# Patient Record
Sex: Male | Born: 1975 | Race: Black or African American | Hispanic: No | Marital: Single | Smoking: Never smoker
Health system: Southern US, Community
[De-identification: ages and names within clinical notes are randomized; demographics above are authoritative.]

## PROBLEM LIST (undated history)

## (undated) DIAGNOSIS — E119 Type 2 diabetes mellitus without complications: Secondary | ICD-10-CM

---

## 2007-06-19 ENCOUNTER — Emergency Department (HOSPITAL_COMMUNITY): Admission: EM | Admit: 2007-06-19 | Discharge: 2007-06-19 | Payer: Self-pay | Admitting: Emergency Medicine

## 2008-11-29 IMAGING — CT CT HEAD W/O CM
1 series · 16 of 30 positions shown, 20 images · non-contrast
Comparison: None

CLINICAL DATA: General headache

CT HEAD WITHOUT CONTRAST
TECHNIQUE: Contiguous axial images were obtained from the base of
the skull through the vertex without contrast.

[Series 2: headseq 4.8 h45s · axial · 0.43mm/px · z∈[-134,+21]mm · 16 of 36 slices shown, 20 images]
[im 2/36  brain]
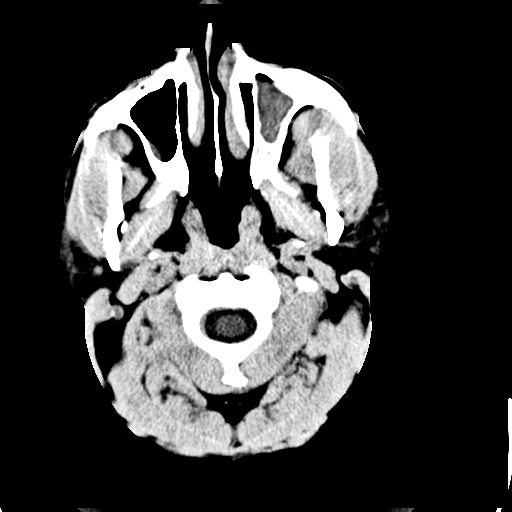
[im 2/36  bone]
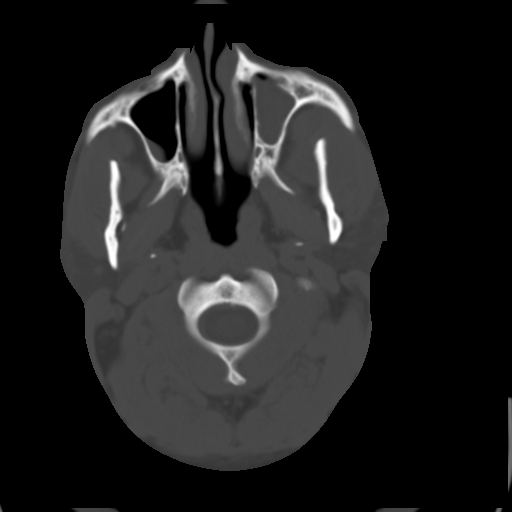
[im 4/36  brain]
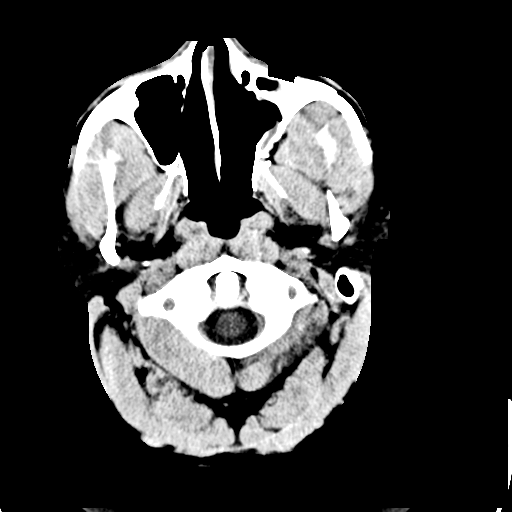
[im 7/36  brain]
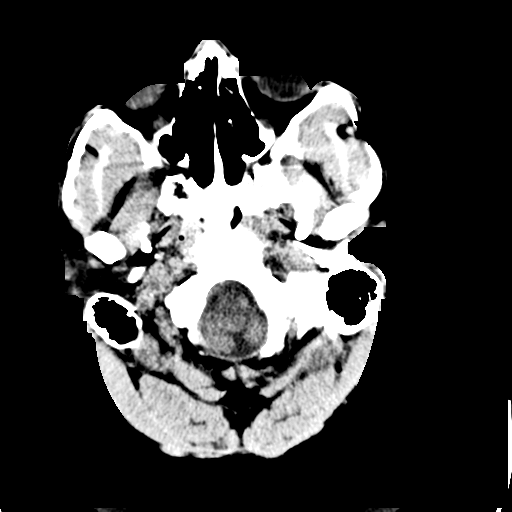
[im 9/36  brain]
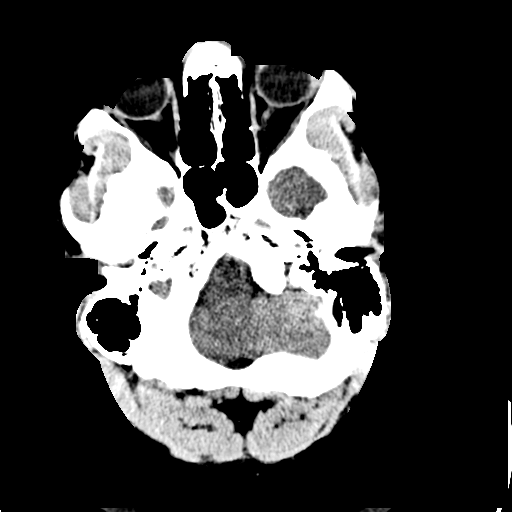
[im 10/36  brain]
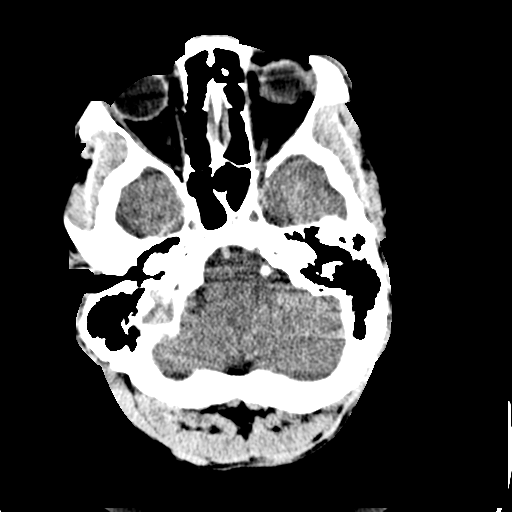
[im 10/36  bone]
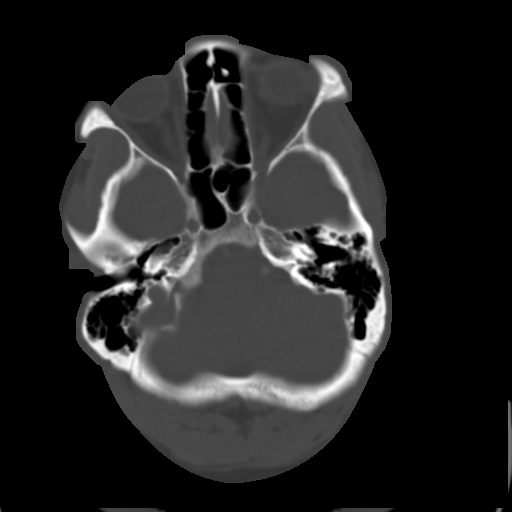
[im 13/36  brain]
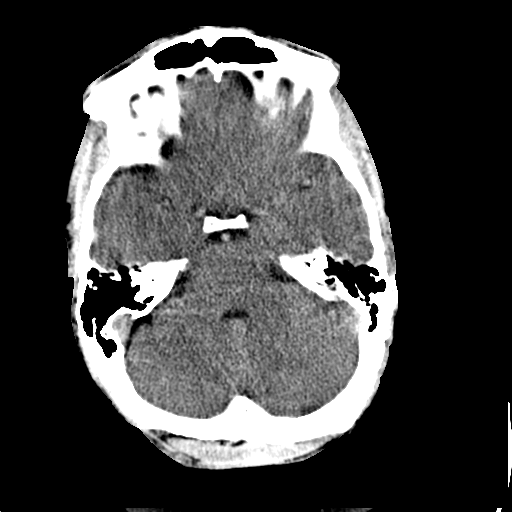
[im 15/36  brain]
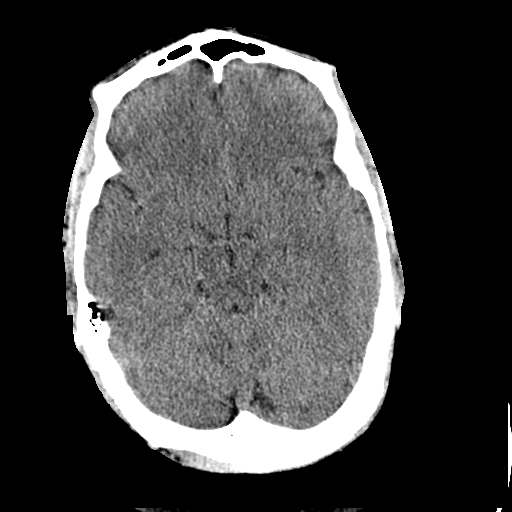
[im 17/36  brain]
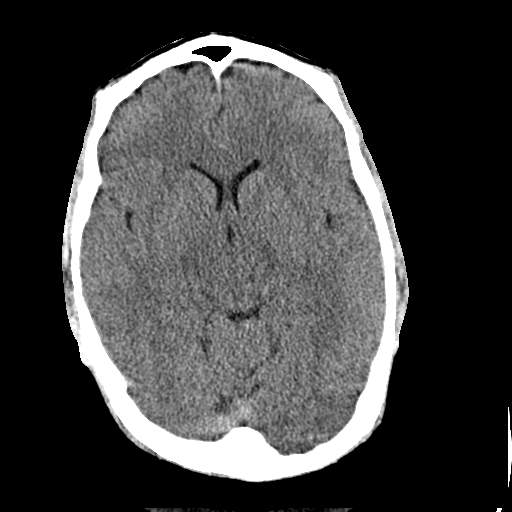
[im 19/36  brain]
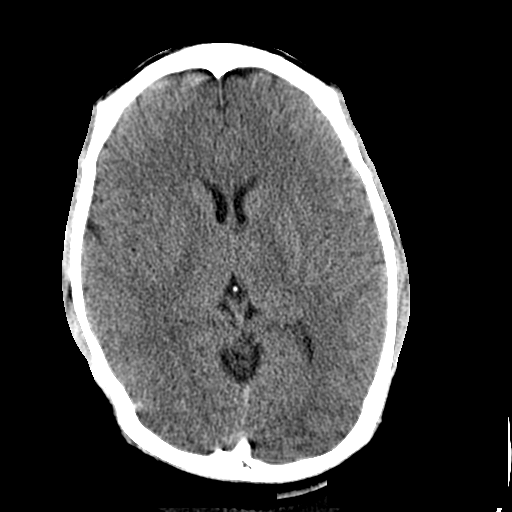
[im 19/36  bone]
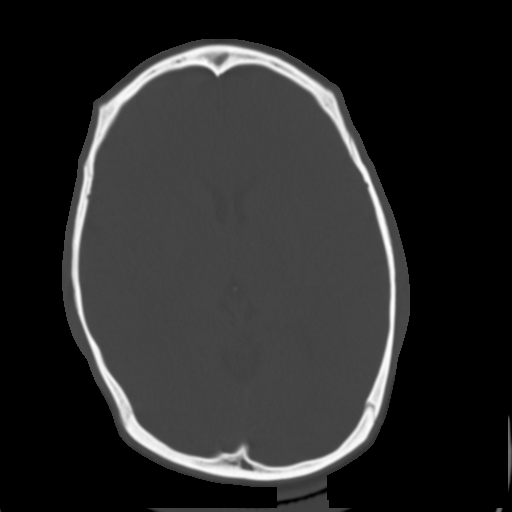
[im 21/36  brain]
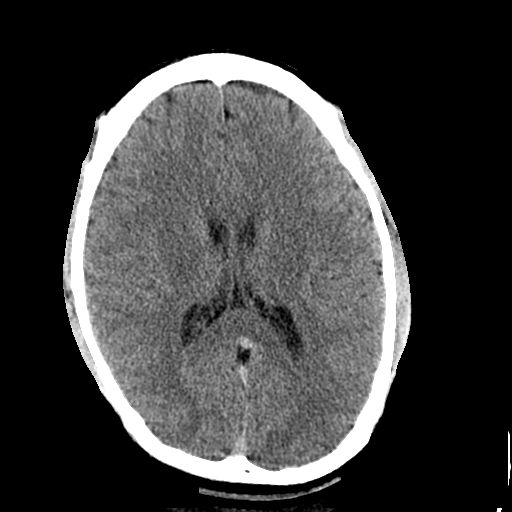
[im 23/36  brain]
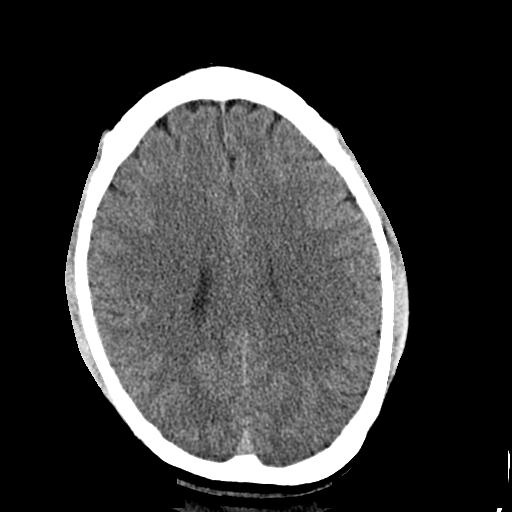
[im 26/36  brain]
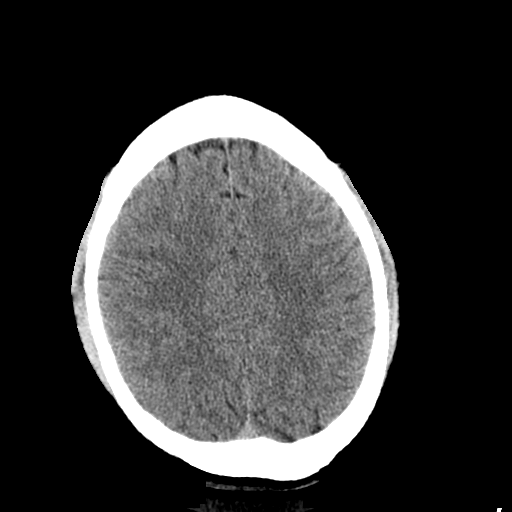
[im 27/36  brain]
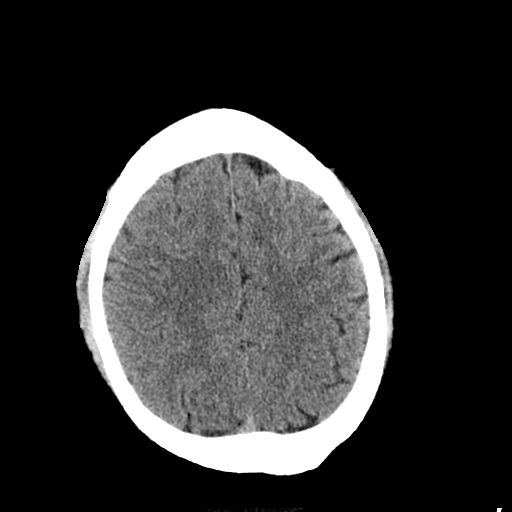
[im 27/36  bone]
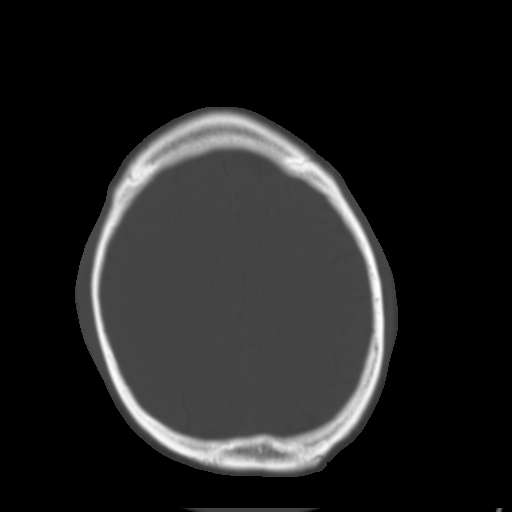
[im 29/36  brain]
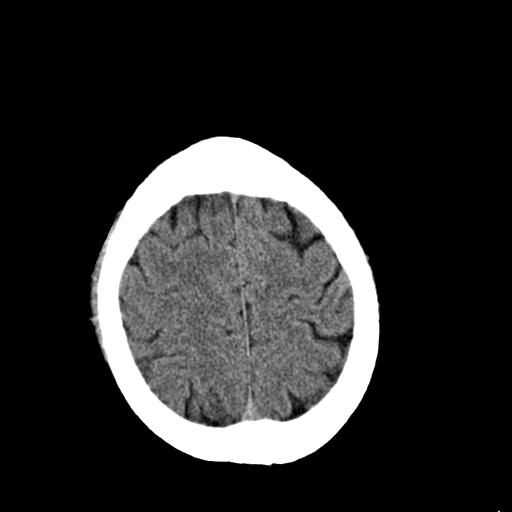
[im 32/36  brain]
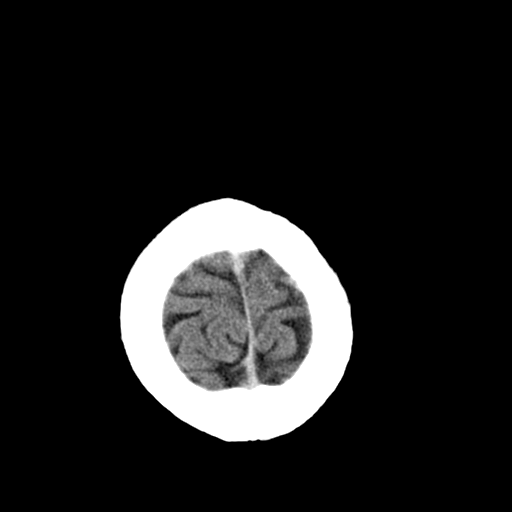
[im 34/36  brain]
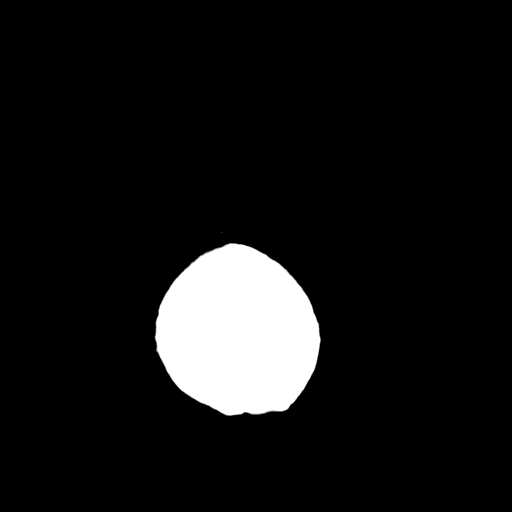

[16 of 30 positions shown; findings below may reference images not displayed]

FINDINGS: There is no evidence of acute intracranial hemorrhage,
mass lesion, brain edema or extra-axial fluid collection.  The
ventricles and subarachnoid spaces are appropriately sized for age.
There is no CT evidence of acute infarction.

The visualized paranasal sinuses are clear aside from probable
bilateral maxillary sinus mucous retention cysts.  There may be
some mucosal thickening in the left maxillary sinus.  The calvarium
is intact.
IMPRESSION: 1.  No acute intracranial findings.
2.  Bilateral maxillary sinus opacification with possible mucosal
thickening on the left.

## 2018-01-24 ENCOUNTER — Ambulatory Visit (HOSPITAL_COMMUNITY)
Admission: EM | Admit: 2018-01-24 | Discharge: 2018-01-24 | Disposition: A | Discharge status: Home / Self Care | Payer: Self-pay | Attending: Family Medicine | Admitting: Family Medicine

## 2018-01-24 ENCOUNTER — Other Ambulatory Visit: Payer: Self-pay

## 2018-01-24 ENCOUNTER — Encounter (HOSPITAL_COMMUNITY): Payer: Self-pay

## 2018-01-24 DIAGNOSIS — R040 Epistaxis: Secondary | ICD-10-CM

## 2018-01-24 HISTORY — DX: Type 2 diabetes mellitus without complications: E11.9

## 2018-01-24 MED ORDER — OXYMETAZOLINE HCL 0.05 % NA SOLN
NASAL | Status: AC
  Filled 2018-01-24: qty 15

## 2018-01-24 MED ORDER — AMOXICILLIN-POT CLAVULANATE 875-125 MG PO TABS: 1.0000 | ORAL_TABLET | Freq: Two times a day (BID) | ORAL | 0 refills | Status: AC

## 2018-01-24 MED ORDER — FLUTICASONE PROPIONATE 50 MCG/ACT NA SUSP: 1.0000 | Freq: Every day | NASAL | 2 refills | Status: DC

## 2018-01-24 NOTE — ED Provider Notes (Signed)
[facility]    CSN: [id number] Arrival date & time: 01/24/18  0815     History   Chief Complaint Chief Complaint  Patient presents with  . Epistaxis    HPI Derrick Waters is a 42 y.o. male.   Patient is a 42 year old male presents with nosebleeding that started yesterday.  Symptoms have been waxing and waning.  He has had 3-4 episodes of bleeding with drainage.  Reports over the past couple weeks he has had a lot of sinus congestion, postnasal drip, sinus pressure.  This is an ongoing problem for him.  He does not usually do anything to treat symptoms.  He has taken allergy medicine in the past without much relief.  He denies any headaches, dizziness.  He is not currently taking blood thinners.  He has had a slight cough.  No fevers.  ROS per HPI      Past Medical History:  Diagnosis Date  . Diabetes mellitus without complication (HCC)     There are no active problems to display for this patient.   History reviewed. No pertinent surgical history.     Home Medications    Prior to Admission medications   Medication Sig Start Date End Date Taking? Authorizing Provider  amoxicillin-clavulanate (AUGMENTIN) 875-125 MG tablet Take 1 tablet by mouth every 12 (twelve) hours. 01/24/18   [place] Berkley, Gloris Manchesterraci A, NP  fluticasone (FLONASE) 50 MCG/ACT nasal spray Place 1 spray into both nostrils daily. 01/24/18   Janace ArisBast, [place] Dube A, NP    Family History History reviewed. No pertinent family history.  Social History Social History   Tobacco Use  . Smoking status: Never Smoker  . Smokeless tobacco: Never Used  Substance Use Topics  . Alcohol use: Not on file  . Drug use: Not on file     Allergies   Patient has no known allergies.   Review of Systems Review of Systems   Physical Exam Triage Vital Signs ED Triage Vitals  Enc Vitals Group     BP 01/24/18 0839 (!) 150/86     Pulse Rate 01/24/18 0839 79     Resp 01/24/18 0839 16     Temp 01/24/18 0839 98.1 F (36.7  C)     Temp Source 01/24/18 0839 Oral     SpO2 01/24/18 0839 97 %     Weight 01/24/18 0845 220 lb (99.8 kg)     Height --      Head Circumference --      Peak Flow --      Pain Score 01/24/18 0844 0     Pain Loc --      Pain Edu? --      Excl. in GC? --    No data found.  Updated Vital Signs BP (!) 150/86 (BP Location: Right Arm)   Pulse 79   Temp 98.1 F (36.7 C) (Oral)   Resp 16   Wt 220 lb (99.8 kg)   SpO2 97%   Visual Acuity Right Eye Distance:   Left Eye Distance:   Bilateral Distance:    Right Eye Near:   Left Eye Near:    Bilateral Near:     Physical Exam  Constitutional: He appears well-developed and well-nourished.  HENT:  Head: Normocephalic and atraumatic.  Nose: Mucosal edema present. Epistaxis is observed. Right sinus exhibits maxillary sinus tenderness and frontal sinus tenderness. Left sinus exhibits maxillary sinus tenderness and frontal sinus tenderness.  Mouth/Throat: Uvula is midline and mucous membranes are normal. Tonsils  are 0 on the right. Tonsils are 0 on the left.  Eyes: Conjunctivae are normal.  Neck: Normal range of motion.  Nursing note and vitals reviewed.    UC Treatments / Results  Labs (all labs ordered are listed, but only abnormal results are displayed) Labs Reviewed - No data to display  EKG None  Radiology No results found.  Procedures Procedures (including critical care time)  Medications Ordered in UC Medications - No data to display  Initial Impression / Assessment and Plan / UC Course  I have reviewed the triage vital signs and the nursing notes.  Pertinent labs & imaging results that were available during my care of the patient were reviewed by me and considered in my medical decision making (see chart for details).     Epistaxis is most likely due to irritation in the mucous membranes from sinus congestion, drainage and below the nose.  He has been congested for over 2 weeks. There is a lot of sinus  tenderness We will go ahead and treat for sinus infection Afrin administered in clinic to stop the nosebleed Nosebleeding controlled Sure precautions on coughing, sneezing, bending over. Told to keep the mucous membranes moist with nasal saline spray Told to only use the Afrin nasal spray if unable to stop the bleeding.  Do not use more than 3 days this will cause rebound congestion. Gave contact for an ear nose and throat if symptoms continue  Final Clinical Impressions(s) / UC Diagnoses   Final diagnoses:  Epistaxis     Discharge Instructions     Use the afrin, 2 to 3 squirts in the nostrils every 12 hours  Do not use more than 3 days.  We will go ahead and treat you for a sinus infection I am also sending some Flonase to the pharmacy for you to use in the future for nasal congestion and sinus issues If symptoms continue follow up with ENT If symptoms worsen go to the ER.     ED Prescriptions    Medication Sig Dispense Auth. Provider   amoxicillin-clavulanate (AUGMENTIN) 875-125 MG tablet Take 1 tablet by mouth every 12 (twelve) hours. 14 tablet Dejanee Thibeaux A, NP   fluticasone (FLONASE) 50 MCG/ACT nasal spray Place 1 spray into both nostrils daily. 16 g Dahlia Byes A, NP     Controlled Substance Prescriptions [place] Controlled Substance Registry consulted? Not Applicable   Janace Aris, NP 01/24/18 [phone]

## 2018-01-24 NOTE — ED Triage Notes (Signed)
Pt cc pt states she has a nose bleed that started yesterday.

## 2018-01-24 NOTE — Discharge Instructions (Addendum)
Use the afrin, 2 to 3 squirts in the nostrils every 12 hours  Do not use more than 3 days.  We will go ahead and treat you for a sinus infection I am also sending some Flonase to the pharmacy for you to use in the future for nasal congestion and sinus issues If symptoms continue follow up with ENT If symptoms worsen go to the ER.

## 2018-01-25 ENCOUNTER — Encounter (HOSPITAL_COMMUNITY)
Admission: EM | Disposition: A | Discharge status: Home / Self Care | Payer: Self-pay | Source: Home / Self Care | Attending: Emergency Medicine

## 2018-01-25 ENCOUNTER — Emergency Department (HOSPITAL_COMMUNITY): Payer: Medicaid - Out of State | Admitting: Anesthesiology

## 2018-01-25 ENCOUNTER — Other Ambulatory Visit: Payer: Self-pay

## 2018-01-25 ENCOUNTER — Encounter (HOSPITAL_COMMUNITY): Payer: Self-pay

## 2018-01-25 ENCOUNTER — Ambulatory Visit: Payer: Self-pay | Admitting: Otolaryngology

## 2018-01-25 ENCOUNTER — Ambulatory Visit (HOSPITAL_COMMUNITY)
Admission: EM | Admit: 2018-01-25 | Discharge: 2018-01-25 | Disposition: A | Discharge status: Home / Self Care | Payer: Medicaid - Out of State | Attending: Emergency Medicine | Admitting: Emergency Medicine

## 2018-01-25 DIAGNOSIS — E119 Type 2 diabetes mellitus without complications: Secondary | ICD-10-CM | POA: Insufficient documentation

## 2018-01-25 DIAGNOSIS — R040 Epistaxis: Secondary | ICD-10-CM | POA: Diagnosis not present

## 2018-01-25 HISTORY — PX: NASAL ENDOSCOPY WITH EPISTAXIS CONTROL: SHX5664

## 2018-01-25 LAB — COMPREHENSIVE METABOLIC PANEL
ALBUMIN: 4.3 g/dL (ref 3.5–5.0)
ALT: 25 U/L (ref 0–44)
ANION GAP: 10 (ref 5–15)
AST: 21 U/L (ref 15–41)
Alkaline Phosphatase: 71 U/L (ref 38–126)
BUN: 11 mg/dL (ref 6–20)
CO2: 24 mmol/L (ref 22–32)
Calcium: 9.2 mg/dL (ref 8.9–10.3)
Chloride: 103 mmol/L (ref 98–111)
Creatinine, Ser: 1.14 mg/dL (ref 0.61–1.24)
GFR calc Af Amer: >60 mL/min (ref >60)
GFR calc non Af Amer: >60 mL/min (ref >60)
GLUCOSE: 142 mg/dL — AB (ref 70–99)
POTASSIUM: 3.9 mmol/L (ref 3.5–5.1)
SODIUM: 137 mmol/L (ref 135–145)
Total Bilirubin: 0.8 mg/dL (ref 0.3–1.2)
Total Protein: 7.9 g/dL (ref 6.5–8.1)

## 2018-01-25 LAB — PROTIME-INR
INR: 1.01
Prothrombin Time: 13.2 seconds (ref 11.4–15.2)

## 2018-01-25 LAB — CBC
HCT: 40.5 % (ref 39.0–52.0)
Hemoglobin: 12.8 g/dL — ABNORMAL LOW (ref 13.0–17.0)
MCH: 27.9 pg (ref 26.0–34.0)
MCHC: 31.6 g/dL (ref 30.0–36.0)
MCV: 88.2 fL (ref 80.0–100.0)
PLATELETS: 333 10*3/uL (ref 150–400)
RBC: 4.59 MIL/uL (ref 4.22–5.81)
RDW: 14 % (ref 11.5–15.5)
WBC: 18.2 10*3/uL — ABNORMAL HIGH (ref 4.0–10.5)
nRBC: 0 % (ref 0.0–0.2)

## 2018-01-25 LAB — CBC WITH DIFFERENTIAL/PLATELET
Abs Immature Granulocytes: 0.04 10*3/uL (ref 0.00–0.07)
BASOS ABS: 0 10*3/uL (ref 0.0–0.1)
Basophils Relative: 0 %
EOS ABS: 0 10*3/uL (ref 0.0–0.5)
EOS PCT: 0 %
HCT: 43.8 % (ref 39.0–52.0)
Hemoglobin: 13.8 g/dL (ref 13.0–17.0)
IMMATURE GRANULOCYTES: 0 %
Lymphocytes Relative: 19 %
Lymphs Abs: 2.1 10*3/uL (ref 0.7–4.0)
MCH: 27.9 pg (ref 26.0–34.0)
MCHC: 31.5 g/dL (ref 30.0–36.0)
MCV: 88.5 fL (ref 80.0–100.0)
Monocytes Absolute: 0.6 10*3/uL (ref 0.1–1.0)
Monocytes Relative: 6 %
NEUTROS ABS: 8.3 10*3/uL — AB (ref 1.7–7.7)
NEUTROS PCT: 75 %
NRBC: 0 % (ref 0.0–0.2)
Platelets: 399 10*3/uL (ref 150–400)
RBC: 4.95 MIL/uL (ref 4.22–5.81)
RDW: 13.8 % (ref 11.5–15.5)
WBC: 11.2 10*3/uL — AB (ref 4.0–10.5)

## 2018-01-25 LAB — ABO/RH: ABO/RH(D): O POS

## 2018-01-25 LAB — CBG MONITORING, ED: GLUCOSE-CAPILLARY: 109 mg/dL — AB (ref 70–99)

## 2018-01-25 LAB — TYPE AND SCREEN
ABO/RH(D): O POS
ANTIBODY SCREEN: NEGATIVE

## 2018-01-25 LAB — GLUCOSE, CAPILLARY
GLUCOSE-CAPILLARY: 138 mg/dL — AB (ref 70–99)
Glucose-Capillary: 119 mg/dL — ABNORMAL HIGH (ref 70–99)

## 2018-01-25 SURGERY — CONTROL OF EPISTAXIS, ENDOSCOPIC
Anesthesia: General | Site: Nose

## 2018-01-25 MED ORDER — OXYMETAZOLINE HCL 0.05 % NA SOLN
NASAL | Status: DC | PRN
  Administered 2018-01-25: 1 via TOPICAL

## 2018-01-25 MED ORDER — CEFAZOLIN SODIUM-DEXTROSE 2-4 GM/100ML-% IV SOLN
INTRAVENOUS | Status: AC
  Filled 2018-01-25: qty 100

## 2018-01-25 MED ORDER — PROPOFOL 10 MG/ML IV BOLUS
INTRAVENOUS | Status: DC | PRN
  Administered 2018-01-25: 150 mg via INTRAVENOUS
  Administered 2018-01-25: 50 mg via INTRAVENOUS

## 2018-01-25 MED ORDER — FENTANYL CITRATE (PF) 250 MCG/5ML IJ SOLN
INTRAMUSCULAR | Status: DC | PRN
  Administered 2018-01-25: 50 ug via INTRAVENOUS
  Administered 2018-01-25: 100 ug via INTRAVENOUS
  Administered 2018-01-25 (×2): 50 ug via INTRAVENOUS

## 2018-01-25 MED ORDER — OXYCODONE HCL 5 MG PO TABS: 5.0000 mg | ORAL_TABLET | Freq: Once | ORAL | Status: DC | PRN

## 2018-01-25 MED ORDER — MIDAZOLAM HCL 5 MG/5ML IJ SOLN
INTRAMUSCULAR | Status: DC | PRN
  Administered 2018-01-25: 2 mg via INTRAVENOUS

## 2018-01-25 MED ORDER — OXYCODONE HCL 5 MG/5ML PO SOLN: 5.0000 mg | Freq: Once | ORAL | Status: DC | PRN

## 2018-01-25 MED ORDER — OXYMETAZOLINE HCL 0.05 % NA SOLN
NASAL | Status: AC
  Filled 2018-01-25: qty 15

## 2018-01-25 MED ORDER — ESMOLOL HCL 100 MG/10ML IV SOLN
INTRAVENOUS | Status: AC
  Filled 2018-01-25: qty 10

## 2018-01-25 MED ORDER — SUCCINYLCHOLINE CHLORIDE 200 MG/10ML IV SOSY
PREFILLED_SYRINGE | INTRAVENOUS | Status: AC
  Filled 2018-01-25: qty 10

## 2018-01-25 MED ORDER — PROMETHAZINE HCL 25 MG/ML IJ SOLN
INTRAMUSCULAR | Status: AC
  Filled 2018-01-25: qty 1

## 2018-01-25 MED ORDER — LIDOCAINE 2% (20 MG/ML) 5 ML SYRINGE
INTRAMUSCULAR | Status: AC
  Filled 2018-01-25: qty 5

## 2018-01-25 MED ORDER — ONDANSETRON HCL 4 MG/2ML IJ SOLN
INTRAMUSCULAR | Status: DC | PRN
  Administered 2018-01-25: 4 mg via INTRAVENOUS

## 2018-01-25 MED ORDER — DEXAMETHASONE SODIUM PHOSPHATE 10 MG/ML IJ SOLN
INTRAMUSCULAR | Status: DC | PRN
  Administered 2018-01-25: 10 mg via INTRAVENOUS

## 2018-01-25 MED ORDER — DEXAMETHASONE SODIUM PHOSPHATE 10 MG/ML IJ SOLN
INTRAMUSCULAR | Status: AC
  Filled 2018-01-25: qty 1

## 2018-01-25 MED ORDER — FENTANYL CITRATE (PF) 250 MCG/5ML IJ SOLN
INTRAMUSCULAR | Status: AC
  Filled 2018-01-25: qty 5

## 2018-01-25 MED ORDER — CHLORHEXIDINE GLUCONATE CLOTH 2 % EX PADS: 6.0000 | MEDICATED_PAD | Freq: Once | CUTANEOUS | Status: DC

## 2018-01-25 MED ORDER — ACETAMINOPHEN 10 MG/ML IV SOLN: 1000.0000 mg | Freq: Once | INTRAVENOUS | Status: DC | PRN

## 2018-01-25 MED ORDER — LACTATED RINGERS IV SOLN
INTRAVENOUS | Status: DC
  Administered 2018-01-25 (×2): via INTRAVENOUS

## 2018-01-25 MED ORDER — ESMOLOL HCL 100 MG/10ML IV SOLN
INTRAVENOUS | Status: DC | PRN
  Administered 2018-01-25: 20 mg via INTRAVENOUS

## 2018-01-25 MED ORDER — MIDAZOLAM HCL 2 MG/2ML IJ SOLN
INTRAMUSCULAR | Status: AC
  Filled 2018-01-25: qty 2

## 2018-01-25 MED ORDER — SUCCINYLCHOLINE CHLORIDE 20 MG/ML IJ SOLN
INTRAMUSCULAR | Status: DC | PRN
  Administered 2018-01-25: 120 mg via INTRAVENOUS

## 2018-01-25 MED ORDER — OXYMETAZOLINE HCL 0.05 % NA SOLN
2.0000 | Freq: Once | NASAL | Status: AC
  Administered 2018-01-25: 2 via NASAL
  Filled 2018-01-25: qty 15

## 2018-01-25 MED ORDER — ROCURONIUM BROMIDE 50 MG/5ML IV SOSY
PREFILLED_SYRINGE | INTRAVENOUS | Status: DC | PRN
  Administered 2018-01-25: 25 mg via INTRAVENOUS

## 2018-01-25 MED ORDER — LIDOCAINE 2% (20 MG/ML) 5 ML SYRINGE
INTRAMUSCULAR | Status: DC | PRN
  Administered 2018-01-25 (×2): 50 mg via INTRAVENOUS

## 2018-01-25 MED ORDER — FENTANYL CITRATE (PF) 100 MCG/2ML IJ SOLN: 25.0000 ug | INTRAMUSCULAR | Status: DC | PRN

## 2018-01-25 MED ORDER — ONDANSETRON HCL 4 MG/2ML IJ SOLN
INTRAMUSCULAR | Status: AC
  Filled 2018-01-25: qty 2

## 2018-01-25 MED ORDER — ROCURONIUM BROMIDE 50 MG/5ML IV SOSY
PREFILLED_SYRINGE | INTRAVENOUS | Status: AC
  Filled 2018-01-25: qty 5

## 2018-01-25 MED ORDER — CEFAZOLIN SODIUM-DEXTROSE 2-4 GM/100ML-% IV SOLN
2.0000 g | INTRAVENOUS | Status: AC
  Administered 2018-01-25: 2 g via INTRAVENOUS

## 2018-01-25 MED ORDER — SUGAMMADEX SODIUM 200 MG/2ML IV SOLN
INTRAVENOUS | Status: DC | PRN
  Administered 2018-01-25: 200 mg via INTRAVENOUS

## 2018-01-25 MED ORDER — PROPOFOL 10 MG/ML IV BOLUS
INTRAVENOUS | Status: AC
  Filled 2018-01-25: qty 20

## 2018-01-25 MED ORDER — PROMETHAZINE HCL 25 MG/ML IJ SOLN
6.2500 mg | INTRAMUSCULAR | Status: DC | PRN
  Administered 2018-01-25: 6.25 mg via INTRAVENOUS

## 2018-01-25 SURGICAL SUPPLY — 28 items
BLADE SURG 15 STRL LF DISP TIS (BLADE) IMPLANT
BLADE SURG 15 STRL SS (BLADE)
CANISTER SUCT 3000ML PPV (MISCELLANEOUS) ×2 IMPLANT
COAGULATOR SUCT 8FR VV (MISCELLANEOUS) ×2 IMPLANT
COVER MAYO STAND STRL (DRAPES) IMPLANT
COVER WAND RF STERILE (DRAPES) IMPLANT
DRAPE HALF SHEET 40X57 (DRAPES) IMPLANT
GAUZE 4X4 16PLY RFD (DISPOSABLE) ×2 IMPLANT
GAUZE SPONGE 2X2 8PLY STRL LF (GAUZE/BANDAGES/DRESSINGS) IMPLANT
GAUZE VASELINE FOILPK 1/2 X 72 (GAUZE/BANDAGES/DRESSINGS) IMPLANT
GLOVE BIOGEL M 7.0 STRL (GLOVE) ×2 IMPLANT
GOWN STRL REUS W/ TWL LRG LVL3 (GOWN DISPOSABLE) ×2 IMPLANT
GOWN STRL REUS W/TWL LRG LVL3 (GOWN DISPOSABLE) ×2
HEMOSTAT SURGICEL .5X2 ABSORB (HEMOSTASIS) IMPLANT
KIT BASIN OR (CUSTOM PROCEDURE TRAY) ×2 IMPLANT
KIT TURNOVER KIT B (KITS) ×2 IMPLANT
NEEDLE HYPO 25GX1X1/2 BEV (NEEDLE) IMPLANT
NS IRRIG 1000ML POUR BTL (IV SOLUTION) ×2 IMPLANT
PAD ARMBOARD 7.5X6 YLW CONV (MISCELLANEOUS) ×2 IMPLANT
PATTIES SURGICAL .5 X3 (DISPOSABLE) ×2 IMPLANT
SPLINT NASAL THERMO PLAST (MISCELLANEOUS) IMPLANT
SPONGE GAUZE 2X2 STER 10/PKG (GAUZE/BANDAGES/DRESSINGS)
SPONGE NEURO XRAY DETECT 1X3 (DISPOSABLE) IMPLANT
SYR CONTROL 10ML LL (SYRINGE) IMPLANT
TOWEL OR 17X24 6PK STRL BLUE (TOWEL DISPOSABLE) ×4 IMPLANT
TRAY ENT MC OR (CUSTOM PROCEDURE TRAY) ×2 IMPLANT
TUBE CONNECTING 12X1/4 (SUCTIONS) IMPLANT
WATER STERILE IRR 1000ML POUR (IV SOLUTION) IMPLANT

## 2018-01-25 NOTE — ED Notes (Signed)
MD notified of pt's continued tachycardia

## 2018-01-25 NOTE — H&P (Signed)
PREOPERATIVE H&P  Chief Complaint: Epistaxis  HPI: Derrick Waters is a 42 y.o. male who presents for evaluation of epistaxis.  Patient initially had a nosebleed on Sunday on the right side that stopped spontaneously.  Patient had another nosebleed on Monday and had another nosebleed on Tuesday and [facility].  Initially the bleeding was from the right side.  Packs were placed on the right side and it started bleeding from the left side.  Packs were then placed on both sides and this slowed the bleeding but he was still bleeding around the packing.  I was subsequently consulted to evaluate epistaxis probably posterior.  Past Medical History:  Diagnosis Date  . Diabetes mellitus without complication (HCC)    History reviewed. No pertinent surgical history. Social History   Socioeconomic History  . Marital status: Single    Spouse name: Not on file  . Number of children: Not on file  . Years of education: Not on file  . Highest education level: Not on file  Occupational History  . Not on file  Social Needs  . Financial resource strain: Not on file  . Food insecurity:    Worry: Not on file    Inability: Not on file  . Transportation needs:    Medical: Not on file    Non-medical: Not on file  Tobacco Use  . Smoking status: Never Smoker  . Smokeless tobacco: Never Used  Substance and Sexual Activity  . Alcohol use: Not on file  . Drug use: Not on file  . Sexual activity: Not on file  Lifestyle  . Physical activity:    Days per week: Not on file    Minutes per session: Not on file  . Stress: Not on file  Relationships  . Social connections:    Talks on phone: Not on file    Gets together: Not on file    Attends religious service: Not on file    Active member of club or organization: Not on file    Attends meetings of clubs or organizations: Not on file    Relationship status: Not on file  Other Topics Concern  . Not on file  Social History Narrative  . Not on  file   History reviewed. No pertinent family history. No Known Allergies Prior to Admission medications   Medication Sig Start Date End Date Taking? Authorizing Provider  amoxicillin-clavulanate (AUGMENTIN) 875-125 MG tablet Take 1 tablet by mouth every 12 (twelve) hours. Patient not taking: Reported on 01/25/2018 01/24/18   Dahlia ByesBast, Traci A, NP  fluticasone (FLONASE) 50 MCG/ACT nasal spray Place 1 spray into both nostrils daily. Patient not taking: Reported on 01/25/2018 01/24/18   Dahlia ByesBast, Traci A, NP     Positive ROS: No previous history of nosebleeds  All other systems have been reviewed and were otherwise negative with the exception of those mentioned in the HPI and as above.  Physical Exam: Vitals:   01/25/18 1500 01/25/18 1610  BP: 120/86 (!) 140/101  Pulse: 94 (!) 124  Resp: 15 16  Temp:    SpO2: 97% 96%    General: Alert, no acute distress Oral: Normal oral mucosa and tonsils Nasal: After removing the nasal packing it appears to be bleeding from the posterior right nasal cavity.  I placed a Rhino Rocket on the right side and is started bleeding from the left side.  I cannot definitely identify site of epistaxis. Neck: No palpable adenopathy or thyroid nodules Ear: Ear canal is  clear with normal appearing TMs Cardiovascular: Regular rate and rhythm, no murmur.  Respiratory: Clear to auscultation Neurologic: Alert and oriented x 3   Assessment/Plan: nasal bleed Plan for Procedure(s): NASAL ENDOSCOPY WITH EPISTAXIS CONTROL   Dillard Cannon, MD 01/25/2018 4:58 PM

## 2018-01-25 NOTE — ED Notes (Signed)
Pt. Nose actively bleeding

## 2018-01-25 NOTE — ED Notes (Signed)
Pt bleeding again from nose; MD notified; pt holding pressure w/ gauze per MD verbal order

## 2018-01-25 NOTE — Anesthesia Procedure Notes (Signed)
Date/Time: 01/25/2018 6:20 PM Performed by: Babs Bertin, CRNA Pre-anesthesia Checklist: Patient identified, Emergency Drugs available, Suction available and Patient being monitored Patient Re-evaluated:Patient Re-evaluated prior to induction Oxygen Delivery Method: Circle System Utilized Preoxygenation: Pre-oxygenation with 100% oxygen Induction Type: IV induction and Rapid sequence Laryngoscope Size: Mac and 3 Grade View: Grade I Tube type: Oral Tube size: 7.5 mm Number of attempts: 1 Airway Equipment and Method: Bite block Placement Confirmation: positive ETCO2 Secured at: 22 cm Tube secured with: Tape Dental Injury: Teeth and Oropharynx as per pre-operative assessment

## 2018-01-25 NOTE — ED Notes (Signed)
ENT MD at bedside.

## 2018-01-25 NOTE — Discharge Instructions (Addendum)
No nose blowing for 3 days Do not use any nasal sprays unless you have any bleeding and can use oxymetazoline or Afrin nasal spray and call Dr Ezzard StandingNewman  [phone] Call Dr Ezzard StandingNewman if you have any problems or questions.  [phone] Continue your antibiotics until completed

## 2018-01-25 NOTE — ED Notes (Signed)
Re-paged ENT to Dr. Rush Landmarkegeler, [phone]; Armida SansLlilbert V.

## 2018-01-25 NOTE — ED Triage Notes (Signed)
Pt states nose bleed intermittently X2 days. States he has been having congestion. Bleeding present in triage, pt tachycardic. Denies hx of HTN. BP 156/94 in triage.

## 2018-01-25 NOTE — ED Triage Notes (Signed)
Unable to obtain IV site ,TC to Per -op to report  Unable to obtain site. Pt sent

## 2018-01-25 NOTE — Transfer of Care (Signed)
Immediate Anesthesia Transfer of Care Note  Patient: Derrick Waters  Procedure(s) Performed: NASAL ENDOSCOPY WITH EPISTAXIS CONTROL (N/A Nose)  Patient Location: PACU  Anesthesia Type:General  Level of Consciousness: awake, alert  and oriented  Airway & Oxygen Therapy: Patient Spontanous Breathing and Patient connected to nasal cannula oxygen  Post-op Assessment: Report given to RN and Post -op Vital signs reviewed and stable  Post vital signs: Reviewed and stable  Last Vitals:  Vitals Value Taken Time  BP 133/77 01/25/2018  7:32 PM  Temp 36.5 C 01/25/2018  7:32 PM  Pulse 103 01/25/2018  7:34 PM  Resp 18 01/25/2018  7:34 PM  SpO2 96 % 01/25/2018  7:34 PM  Vitals shown include unvalidated device data.  Last Pain:  Vitals:   01/25/18 1932  TempSrc:   PainSc: Asleep         Complications: No apparent anesthesia complications

## 2018-01-25 NOTE — Anesthesia Preprocedure Evaluation (Addendum)
Anesthesia Evaluation  Patient identified by MRN, date of birth, ID band Patient awake    Reviewed: Allergy & Precautions, NPO status , Patient's Chart, lab work & pertinent test results  History of Anesthesia Complications Negative for: history of anesthetic complications  Airway Mallampati: II  TM Distance: >3 FB Neck ROM: Full    Dental  (+) Teeth Intact, Dental Advisory Given   Pulmonary neg pulmonary ROS,    Pulmonary exam normal breath sounds clear to auscultation       Cardiovascular negative cardio ROS Normal cardiovascular exam Rhythm:Regular Rate:Normal     Neuro/Psych negative neurological ROS     GI/Hepatic negative GI ROS, Neg liver ROS,   Endo/Other  diabetes, Well Controlled, Type 2  Renal/GU negative Renal ROS     Musculoskeletal negative musculoskeletal ROS (+)   Abdominal   Peds  Hematology negative hematology ROS (+) Acute bilateral epistaxis unable to be controlled in ED   Anesthesia Other Findings Day of surgery medications reviewed with the patient.  Reproductive/Obstetrics                           Anesthesia Physical Anesthesia Plan  ASA: II and emergent  Anesthesia Plan: General   Post-op Pain Management:    Induction: Intravenous and Rapid sequence  PONV Risk Score and Plan: 2 and Treatment may vary due to age or medical condition, Ondansetron and Dexamethasone  Airway Management Planned: Oral ETT  Additional Equipment:   Intra-op Plan:   Post-operative Plan: Extubation in OR  Informed Consent: I have reviewed the patients History and Physical, chart, labs and discussed the procedure including the risks, benefits and alternatives for the proposed anesthesia with the patient or authorized representative who has indicated his/her understanding and acceptance.   Dental advisory given  Plan Discussed with: CRNA  Anesthesia Plan Comments:         Anesthesia Quick Evaluation

## 2018-01-25 NOTE — Progress Notes (Signed)
Per Dr Ezzard StandingNewman verbal order, waiting on CBC to result. If Hgb less than 10, patient to take iron tablet at home, if Hgb greater than 10, no need for iron supplement. Will call Dr Ezzard StandingNewman with results

## 2018-01-25 NOTE — ED Provider Notes (Signed)
[facility] EMERGENCY DEPARTMENT Provider Note   CSN: [id number] Arrival date & time: 01/25/18  1047     History   Chief Complaint Chief Complaint  Patient presents with  . Epistaxis    HPI Derrick Waters is a 42 y.o. male.  The history is provided by the patient and medical records. No language interpreter was used.  Epistaxis   This is a recurrent problem. The current episode started 3 to 5 hours ago. The problem occurs constantly. The problem has not changed since onset.The problem is associated with an unknown factor. The bleeding has been from both nares. He has tried vasoconstrictors for the symptoms. The treatment provided no relief. His past medical history is significant for colds and sinus problems (sinus infection). His past medical history does not include bleeding disorder or frequent nosebleeds.    Past Medical History:  Diagnosis Date  . Diabetes mellitus without complication (HCC)     There are no active problems to display for this patient.   History reviewed. No pertinent surgical history.      Home Medications    Prior to Admission medications   Medication Sig Start Date End Date Taking? Authorizing Provider  amoxicillin-clavulanate (AUGMENTIN) 875-125 MG tablet Take 1 tablet by mouth every 12 (twelve) hours. 01/24/18   Bast, Gloris [place] A, NP  fluticasone (FLONASE) 50 MCG/ACT nasal spray Place 1 spray into both nostrils daily. 01/24/18   Janace Aris, NP    Family History History reviewed. No pertinent family history.  Social History Social History   Tobacco Use  . Smoking status: Never Smoker  . Smokeless tobacco: Never Used  Substance Use Topics  . Alcohol use: Not on file  . Drug use: Not on file     Allergies   Patient has no known allergies.   Review of Systems Review of Systems  Constitutional: Negative for chills, diaphoresis, fatigue and fever.  HENT: Positive for congestion, nosebleeds and sinus pain. Negative  for trouble swallowing and voice change.   Eyes: Negative for visual disturbance.  Respiratory: Negative for cough, chest tightness, shortness of breath, wheezing and stridor.   Gastrointestinal: Negative for abdominal pain, constipation, diarrhea, nausea and vomiting.  Genitourinary: Negative for flank pain.  Musculoskeletal: Negative for back pain, neck pain and neck stiffness.  Skin: Negative for rash and wound.  Neurological: Negative for light-headedness, numbness and headaches.  Psychiatric/Behavioral: Negative for agitation and confusion.  All other systems reviewed and are negative.    Physical Exam Updated Vital Signs BP (!) 150/96   Pulse (!) 115   Temp 97.7 F (36.5 C) (Oral)   Resp 18   Ht 5\' 10"  (1.778 m)   Wt 99.8 kg   SpO2 97%   BMI 31.57 kg/m   Physical Exam  Constitutional: He appears well-developed and well-nourished. No distress.  HENT:  Head: Atraumatic.  Right Ear: External ear normal.  Left Ear: External ear normal.  Nose: No septal deviation or nasal septal hematoma. Epistaxis (bilateral) is observed. Right sinus exhibits frontal sinus tenderness. Left sinus exhibits frontal sinus tenderness.  Mouth/Throat: Oropharynx is clear and moist. No oropharyngeal exudate.  Eyes: Pupils are equal, round, and reactive to light. Conjunctivae and EOM are normal.  Neck: Normal range of motion. Neck supple.  Cardiovascular: Normal rate and regular rhythm.  No murmur heard. Pulmonary/Chest: Effort normal and breath sounds normal. No respiratory distress. He has no wheezes. He has no rales. He exhibits no tenderness.  Abdominal: Soft. There is  no tenderness.  Musculoskeletal: He exhibits no edema or tenderness.  Lymphadenopathy:    He has no cervical adenopathy.  Neurological: He is alert.  Skin: Skin is warm and dry. Capillary refill takes less than 2 seconds. He is not diaphoretic. No erythema. No pallor.  Psychiatric: He has a normal mood and affect.  Nursing  note and vitals reviewed.    ED Treatments / Results  Labs (all labs ordered are listed, but only abnormal results are displayed) Labs Reviewed  CBC WITH DIFFERENTIAL/PLATELET - Abnormal; Notable for the following components:      Result Value   WBC 11.2 (*)    Neutro Abs 8.3 (*)    All other components within normal limits  COMPREHENSIVE METABOLIC PANEL - Abnormal; Notable for the following components:   Glucose, Bld 142 (*)    All other components within normal limits  CBG MONITORING, ED - Abnormal; Notable for the following components:   Glucose-Capillary 109 (*)    All other components within normal limits  PROTIME-INR  TYPE AND SCREEN  ABO/RH    EKG None  Radiology No results found.  Procedures .Epistaxis Management Date/Time: 01/25/2018 12:30 PM Performed by: Heide Scales, MD Authorized by: Heide Scales, MD   Consent:    Consent obtained:  Verbal   Consent given by:  Patient   Risks discussed:  Bleeding, nasal injury, pain and infection   Alternatives discussed:  No treatment Anesthesia (see MAR for exact dosages):    Anesthesia method:  None Procedure details:    Treatment site:  L posterior   Treatment method:  Nasal balloon   Treatment complexity:  Limited   Treatment episode: recurring   Post-procedure details:    Assessment:  Bleeding stopped   Patient tolerance of procedure:  Tolerated well, no immediate complications Comments:     After intervention and monitoring, patient began to rebleed.   (including critical care time)  Medications Ordered in ED Medications  oxymetazoline (AFRIN) 0.05 % nasal spray 2 spray (2 sprays Each Nare Given 01/25/18 1121)     Initial Impression / Assessment and Plan / ED Course  I have reviewed the triage vital signs and the nursing notes.  Pertinent labs & imaging results that were available during my care of the patient were reviewed by me and considered in my medical decision making (see  chart for details).     Derrick Waters is a 42 y.o. male with a past medical history significant for diabetes and recent diagnosis of sinus infection who presents for epistaxis.  Patient reports that for the last 4 days he has had on and off epistaxis.  He reports he had congestion for the last week or so and sinus discomfort.  He says that he had a nosebleed yesterday and with his sinus discomfort went to urgent care.  They diagnosed him with a sinus infection and prescribed Augmentin.  He reports that he came back to the emergency department today after he started having nosebleed this morning that would not stop with pressure.  On arrival, patient was tachycardic in the 120s.  He was not hypotensive.  On exam, patient had brisk epistaxis out of both nares.  There is also bleeding in the posterior oropharynx.  Initially, patient had his nose inspected and there is no evidence of anterior bleed.  Afrin was used as well as pressure with continued bleeding.  Patient then had several attempts at nasal packing with a rapid Rhino.  A long rapid  Rhino in the left nare was eventually able to stop the bleeding.  Due to the patient's tachycardia on arrival, patient had blood work ordered.    If his blood work is reassuring and bleed remains stopped, patient will be discharged home to follow-up with ENT for packing removal.  1:56 PM Patient continues to have rebleeding out of his nares.  The bleeding has soaked through the rapid Rhino on the left side and is still bleeding intermittently out of the right nare.  ENT will be re-paged.  Labs were reassuring with no anemia or trauma cytopenia.  Metabolic panel was reassuring.  INR was normal.  Heart rate is improved in the ED.  2:38 PM Patient continues to have intermittent bleeding despite the packing, pressure, and Afrin.  ENT will be repaged.  ENT will come see the patient for further management.  Anticipate discharge after ENT management.  4:42  PM Patient was seen by ENT and they were unable to stop the bleeding.  He will be taken to the operating room for operative management.  Next  Anticipate further management by ENT.  Final Clinical Impressions(s) / ED Diagnoses   Final diagnoses:  Epistaxis    ED Discharge Orders    None     Clinical Impression: 1. Epistaxis     Disposition: Admit to OR with ENT  This note was prepared with assistance of Dragon voice recognition software. Occasional wrong-word or sound-a-like substitutions may have occurred due to the inherent limitations of voice recognition software.     Amali Uhls, Canary Brimhristopher J, MD 01/25/18 [phone]

## 2018-01-25 NOTE — ED Notes (Signed)
Re-Re-paged ENT ([id number]) to Dr. Rush Landmarkegeler, [phone]

## 2018-01-26 ENCOUNTER — Encounter (HOSPITAL_COMMUNITY): Payer: Self-pay | Admitting: Otolaryngology

## 2018-01-26 NOTE — Anesthesia Postprocedure Evaluation (Signed)
Anesthesia Post Note  Patient: Derrick Waters  Procedure(s) Performed: NASAL ENDOSCOPY WITH EPISTAXIS CONTROL (N/A Nose)     Patient location during evaluation: PACU Anesthesia Type: General Level of consciousness: awake and alert Pain management: pain level controlled Vital Signs Assessment: post-procedure vital signs reviewed and stable Respiratory status: spontaneous breathing, nonlabored ventilation, respiratory function stable and patient connected to nasal cannula oxygen Cardiovascular status: blood pressure returned to baseline and stable Postop Assessment: no apparent nausea or vomiting Anesthetic complications: no    Last Vitals:  Vitals:   01/25/18 2130 01/25/18 2145  BP:  133/72  Pulse: 85 84  Resp: 15 18  Temp:  (!) 36.4 C  SpO2: 96% 100%    Last Pain:  Vitals:   01/25/18 2115  TempSrc:   PainSc: Asleep                 Keegan Bensch DAVID

## 2018-01-26 NOTE — Op Note (Signed)
Derrick Waters, Derrick Waters Derrick Waters [id number] ACCOUNT [id number] DATE OF BIRTH:[birth year] FACILITY: [facility] LOCATION: [facility] PHYSICIAN:Gaudencio Chesnut Braxton FeathersE. Messiah Ahr, MD  OPERATIVE REPORT  DATE OF PROCEDURE:  01/25/2018  PREOPERATIVE DIAGNOSIS:  Chronic epistaxis.  POSTOPERATIVE DIAGNOSIS:  Posterior right epistaxis.  OPERATION PERFORMED:  Endoscopic cauterization of right-sided epistaxis.  SURGEON:  Dillard Cannonhristopher Foday [facility], MD  ANESTHESIA:  General endotracheal.  COMPLICATIONS:  None.  BRIEF CLINICAL NOTE:  This a 42 year old gentleman who initially had his first episode of epistaxis on Sunday.  He had another round of epistaxis from the right nasal cavity on Monday.  He had recurrent epistaxis on Tuesday and presented [facility].  The  right side of the nose was packed and he started bleeding from the left side of the nose.  A pack was placed on both sides and he continued to have bleeding and  I was subsequently consulted to the emergency room.  On exam in the emergency room, the  patient had a right posterior epistaxis.  This could not be visualized in the emergency room and patient was subsequently taken to the operating room for endoscopic cauterization of epistaxis.  DESCRIPTION OF PROCEDURE:  The patient was brought up to the operating room from the emergency room.  He had packing in both sides of his nose.  First, the right side, Rhino Rocket was removed.  The patient had bleeding arising from the lateral aspect of  the inferior turbinate and the blood was running down posteriorly.  It was hard to visualize the exact site of bleeding from the inferior meatus.  The nose on the right side was packed with cotton pledgets soaked in Afrin to help control the bleeding.   Then, using the 30-degree endoscope, cauterization of the inferior meatus was performed.  The lateral portion of the inferior turbinate and the lateral nasal wall was cauterized from the mid area of the inferior meatus.  Back  posteriorly.  Following  cauterization of the inferior meatus and the lateral portion of the inferior turbinate, bleeding had stopped.  There was no active bleeding noted.  The left nasal packing was removed and there was minimal blood  on the left side and no active bleeding  noted on the left side.  Following cauterization of this region, the area was packed with a SNoW placed in the inferior meatus where the bleeding had originated from.  After placing the packing.  There is no active bleeding noted.  The oropharynx was  suctioned of several blood clots.  No active bleeding was noted and patient was awoken from anesthesia and transferred to recovery room postoperatively doing well.  DISPOSITION:  The patient will be observed in the recovery room for an hour and then discharged home, if he has no further bleeding.  Instructed not to blow his nose for 3 days.  He will continue antibiotics he has been taken for sinus infection and will  follow up in my office p.r.n. any further nosebleeds.  AN/NUANCE  D:01/25/2018 T:01/26/2018 JOB:[id number]
